# Patient Record
Sex: Male | Born: 1955 | Race: Asian | Hispanic: No | Marital: Married | Smoking: Never smoker
Health system: Southern US, Community
[De-identification: ages and names within clinical notes are randomized; demographics above are authoritative.]

## PROBLEM LIST (undated history)

## (undated) HISTORY — PX: APPENDECTOMY: SHX54

---

## 1997-12-18 ENCOUNTER — Encounter: Payer: Self-pay | Admitting: Emergency Medicine

## 1997-12-18 ENCOUNTER — Emergency Department (HOSPITAL_COMMUNITY): Admission: EM | Admit: 1997-12-18 | Discharge: 1997-12-18 | Payer: Self-pay | Admitting: Emergency Medicine

## 1997-12-19 ENCOUNTER — Inpatient Hospital Stay (HOSPITAL_COMMUNITY): Admission: EM | Admit: 1997-12-19 | Discharge: 1997-12-25 | Payer: Self-pay | Admitting: Emergency Medicine

## 1997-12-20 ENCOUNTER — Encounter: Payer: Self-pay | Admitting: Internal Medicine

## 1997-12-21 ENCOUNTER — Encounter: Payer: Self-pay | Admitting: Internal Medicine

## 1997-12-31 ENCOUNTER — Encounter: Admission: RE | Admit: 1997-12-31 | Discharge: 1997-12-31 | Payer: Self-pay | Admitting: Internal Medicine

## 2002-11-27 ENCOUNTER — Ambulatory Visit (HOSPITAL_BASED_OUTPATIENT_CLINIC_OR_DEPARTMENT_OTHER): Admission: RE | Admit: 2002-11-27 | Discharge: 2002-11-27 | Payer: Self-pay | Admitting: Orthopaedic Surgery

## 2016-08-28 ENCOUNTER — Emergency Department (HOSPITAL_COMMUNITY)
Admission: EM | Admit: 2016-08-28 | Discharge: 2016-08-29 | Disposition: A | Discharge status: Home / Self Care | Payer: Self-pay | Attending: Emergency Medicine | Admitting: Emergency Medicine

## 2016-08-28 ENCOUNTER — Emergency Department (HOSPITAL_COMMUNITY): Payer: Self-pay

## 2016-08-28 ENCOUNTER — Encounter (HOSPITAL_COMMUNITY): Payer: Self-pay

## 2016-08-28 DIAGNOSIS — Y999 Unspecified external cause status: Secondary | ICD-10-CM | POA: Insufficient documentation

## 2016-08-28 DIAGNOSIS — Y9389 Activity, other specified: Secondary | ICD-10-CM | POA: Insufficient documentation

## 2016-08-28 DIAGNOSIS — Y9289 Other specified places as the place of occurrence of the external cause: Secondary | ICD-10-CM | POA: Insufficient documentation

## 2016-08-28 DIAGNOSIS — W19XXXA Unspecified fall, initial encounter: Secondary | ICD-10-CM

## 2016-08-28 DIAGNOSIS — S2241XA Multiple fractures of ribs, right side, initial encounter for closed fracture: Secondary | ICD-10-CM | POA: Insufficient documentation

## 2016-08-28 DIAGNOSIS — R52 Pain, unspecified: Secondary | ICD-10-CM

## 2016-08-28 DIAGNOSIS — W1789XA Other fall from one level to another, initial encounter: Secondary | ICD-10-CM | POA: Insufficient documentation

## 2016-08-28 MED ORDER — OXYCODONE-ACETAMINOPHEN 5-325 MG PO TABS
1.0000 | ORAL_TABLET | Freq: Once | ORAL | Status: AC
  Administered 2016-08-29: 1 via ORAL
  Filled 2016-08-28: qty 1

## 2016-08-28 NOTE — ED Provider Notes (Signed)
[facility]-EMERGENCY DEPT Provider Note   CSN: [id number] Arrival date & time: 08/28/16  2210     History   Chief Complaint Chief Complaint  Patient presents with  . Fall    HPI Arthur Hernandez is a 61 y.o. male.  HPI   Patient p/w right posterior rib pain after slipping and falling out of the back of his pickup truck while unloading gear from a fishing trip.  Family reports he slipped and fell directly on his right ribs/back on the concrete.  Denies hitting head or LOC, anterior chest, abdominal pain, extremity pain/numbness/weakness.  Denies any other pain or injury.    History reviewed. No pertinent past medical history.  There are no active problems to display for this patient.   History reviewed. No pertinent surgical history.     Home Medications    Prior to Admission medications   Medication Sig Start Date End Date Taking? Authorizing Provider  oxyCODONE-acetaminophen (PERCOCET/ROXICET) 5-325 MG tablet Take 1-2 tablets by mouth every 4 (four) hours as needed for severe pain. 08/29/16   Trixie Dredge, PA-C    Family History History reviewed. No pertinent family history.  Social History Social History  Substance Use Topics  . Smoking status: Not on file  . Smokeless tobacco: Not on file  . Alcohol use Not on file     Allergies   Patient has no allergy information on record.   Review of Systems Review of Systems  All other systems reviewed and are negative.    Physical Exam Updated Vital Signs BP 111/71   Pulse (!) 59   Temp 99.1 F (37.3 C) (Oral)   Resp 18   SpO2 99%   Physical Exam  Constitutional: He appears well-developed and well-nourished. No distress.  HENT:  Head: Normocephalic and atraumatic.  Neck: Neck supple.  Cardiovascular: Normal rate, regular rhythm and intact distal pulses.   Pulmonary/Chest: Effort normal and breath sounds normal. No respiratory distress. He has no wheezes. He has no rales.      Abdominal: Soft. He  exhibits no distension and no mass. There is no tenderness. There is no rebound and no guarding.  Musculoskeletal:  Extremities:  Strength 5/5, sensation intact, distal pulses intact.    Neurological: He is alert. He exhibits normal muscle tone.  Skin: He is not diaphoretic.  Nursing note and vitals reviewed.    ED Treatments / Results  Labs (all labs ordered are listed, but only abnormal results are displayed) Labs Reviewed - No data to display  EKG  EKG Interpretation None       Radiology Dg Chest 2 View  Addendum Date: 08/28/2016   ADDENDUM REPORT: 08/28/2016 23:22 ADDENDUM: There is a minimally displaced right posterior eighth rib fracture better visualized on the dedicated rib radiographs. No pneumothorax is seen. Electronically Signed   By: Tollie Eth M.D.   On: 08/28/2016 23:22   Result Date: 08/28/2016 CLINICAL DATA:  Patient fell from pickup truck several hours ago. Right-sided posterior rib pain. EXAM: CHEST  2 VIEW COMPARISON:  Thoracic spine from 08/28/2016 FINDINGS: The cardiac silhouette is within normal limits for size. There is mild aortic atherosclerosis without aneurysm. No pneumothorax effusion or pulmonary consolidation is seen. No acute displaced appearing rib fracture is noted. Degenerative changes are noted of the dorsal spine without acute appearing fracture nor retropulsion. IMPRESSION: Aortic atherosclerosis. No acute pulmonary disease. No acute fracture of the bony thorax is identified. Electronically Signed: By: Tollie Eth M.D. On: 08/28/2016 23:13  Dg Ribs Unilateral Right  Result Date: 08/28/2016 CLINICAL DATA:  Right posterior rib pain after fall from truck EXAM: RIGHT RIBS - 2 VIEW COMPARISON:  None. FINDINGS: Minimally displaced posterior right eighth rib fracture without pneumothorax is noted. The linear lucency without displacement is also noted of the right tenth rib. No effusion. IMPRESSION: Subtle minimally displaced right posterior eighth rib  fracture with equivocal nondisplaced posterior right tenth rib fracture. No pneumothorax or hemothorax. Electronically Signed   By: Tollie Eth M.D.   On: 08/28/2016 23:21   Dg Thoracic Spine 2 View  Result Date: 08/28/2016 CLINICAL DATA:  61 year old male with fall and back pain. EXAM: THORACIC SPINE 2 VIEWS COMPARISON:  Chest radiograph dated 08/28/2016 FINDINGS: There is no acute fracture of the thoracic spine. There is mild degenerative changes. Multiple mid to lower thoracic mild compression changes appear chronic. Soft tissues appear unremarkable. IMPRESSION: No definite acute/traumatic thoracic spine pathology. Electronically Signed   By: Elgie Collard M.D.   On: 08/28/2016 23:12   Dg Lumbar Spine Complete  Result Date: 08/28/2016 CLINICAL DATA:  Fall from pickup truck, back and RIGHT rib pain. EXAM: LUMBAR SPINE - COMPLETE 4+ VIEW COMPARISON:  None. FINDINGS: Five non rib-bearing lumbar-type vertebral bodies are intact with maintenance of the lumbar lordosis. Grade 1 L4-5 anterolisthesis associated with severe disc height loss, endplate sclerosis and marginal spurring. Bilateral L4 pars interarticularis defects. No destructive bony lesions. Sacroiliac joints are symmetric. Included prevertebral and paraspinal soft tissue planes are non-suspicious. IMPRESSION: No acute fracture deformity. 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 Electronically Signed   By: Awilda Metro M.D.   On: 08/28/2016 23:14    Procedures Procedures (including critical care time)  Medications Ordered in ED Medications  oxyCODONE-acetaminophen (PERCOCET/ROXICET) 5-325 MG per tablet 1 tablet (not administered)     Initial Impression / Assessment and Plan / ED Course  I have reviewed the triage vital signs and the nursing notes.  Pertinent labs & imaging results that were available during my care of the patient were reviewed by me and considered  in my medical decision making (see chart for details).     Afebrile, nontoxic patient with right posterior rib pain after mechanical fall out of pickup truck.  Denies other injury.  He is otherwise healthy.  Breathing well.  Pain control, incentive spirometer.  O2 normal.   Definitive fracture care for rib fractures performed.  Discussed return precautions, home care.  D/C home with pain medication, incentive spirometer.  Discussed result, findings, treatment, and follow up  with patient.  Pt given return precautions.  Pt verbalizes understanding and agrees with plan.       Final Clinical Impressions(s) / ED Diagnoses   Final diagnoses:  Closed fracture of multiple ribs of right side, initial encounter  Fall, initial encounter    New Prescriptions New Prescriptions   OXYCODONE-ACETAMINOPHEN (PERCOCET/ROXICET) 5-325 MG TABLET    Take 1-2 tablets by mouth every 4 (four) hours as needed for severe pain.     Trixie Dredge, PA-C 08/29/16 0021    Zadie Rhine, MD 08/30/16 [phone]

## 2016-08-28 NOTE — ED Triage Notes (Signed)
Pt here for pain to lower ribs after falling form truck onto driveway

## 2016-08-29 MED ORDER — OXYCODONE-ACETAMINOPHEN 5-325 MG PO TABS: 1.0000 | ORAL_TABLET | ORAL | 0 refills | Status: AC | PRN

## 2016-08-29 NOTE — Discharge Instructions (Signed)
Read the information below.  Use the prescribed medication as directed.  Please discuss all new medications with your pharmacist.  Do not take additional tylenol while taking the prescribed pain medication to avoid overdose.  You may return to the Emergency Department at any time for worsening condition or any new symptoms that concern you.     If you develop worsening chest/back pain, shortness of breath, fever, you pass out, or become weak or dizzy, return to the ER for a recheck.

## 2016-08-29 NOTE — ED Notes (Signed)
Pt given incentive spirometer and demonstrated appropriate use.

## 2018-06-25 IMAGING — DX DG CHEST 2V
2 series · 2 of 2 positions shown · non-contrast
Comparison: Thoracic spine from 08/28/2016

ADDENDUM:
There is a minimally displaced right posterior eighth rib fracture
better visualized on the dedicated rib radiographs. No pneumothorax
is seen.
CLINICAL DATA: Patient fell from pickup truck several hours ago.
Right-sided posterior rib pain.

EXAM:
CHEST  2 VIEW

[chest pa]
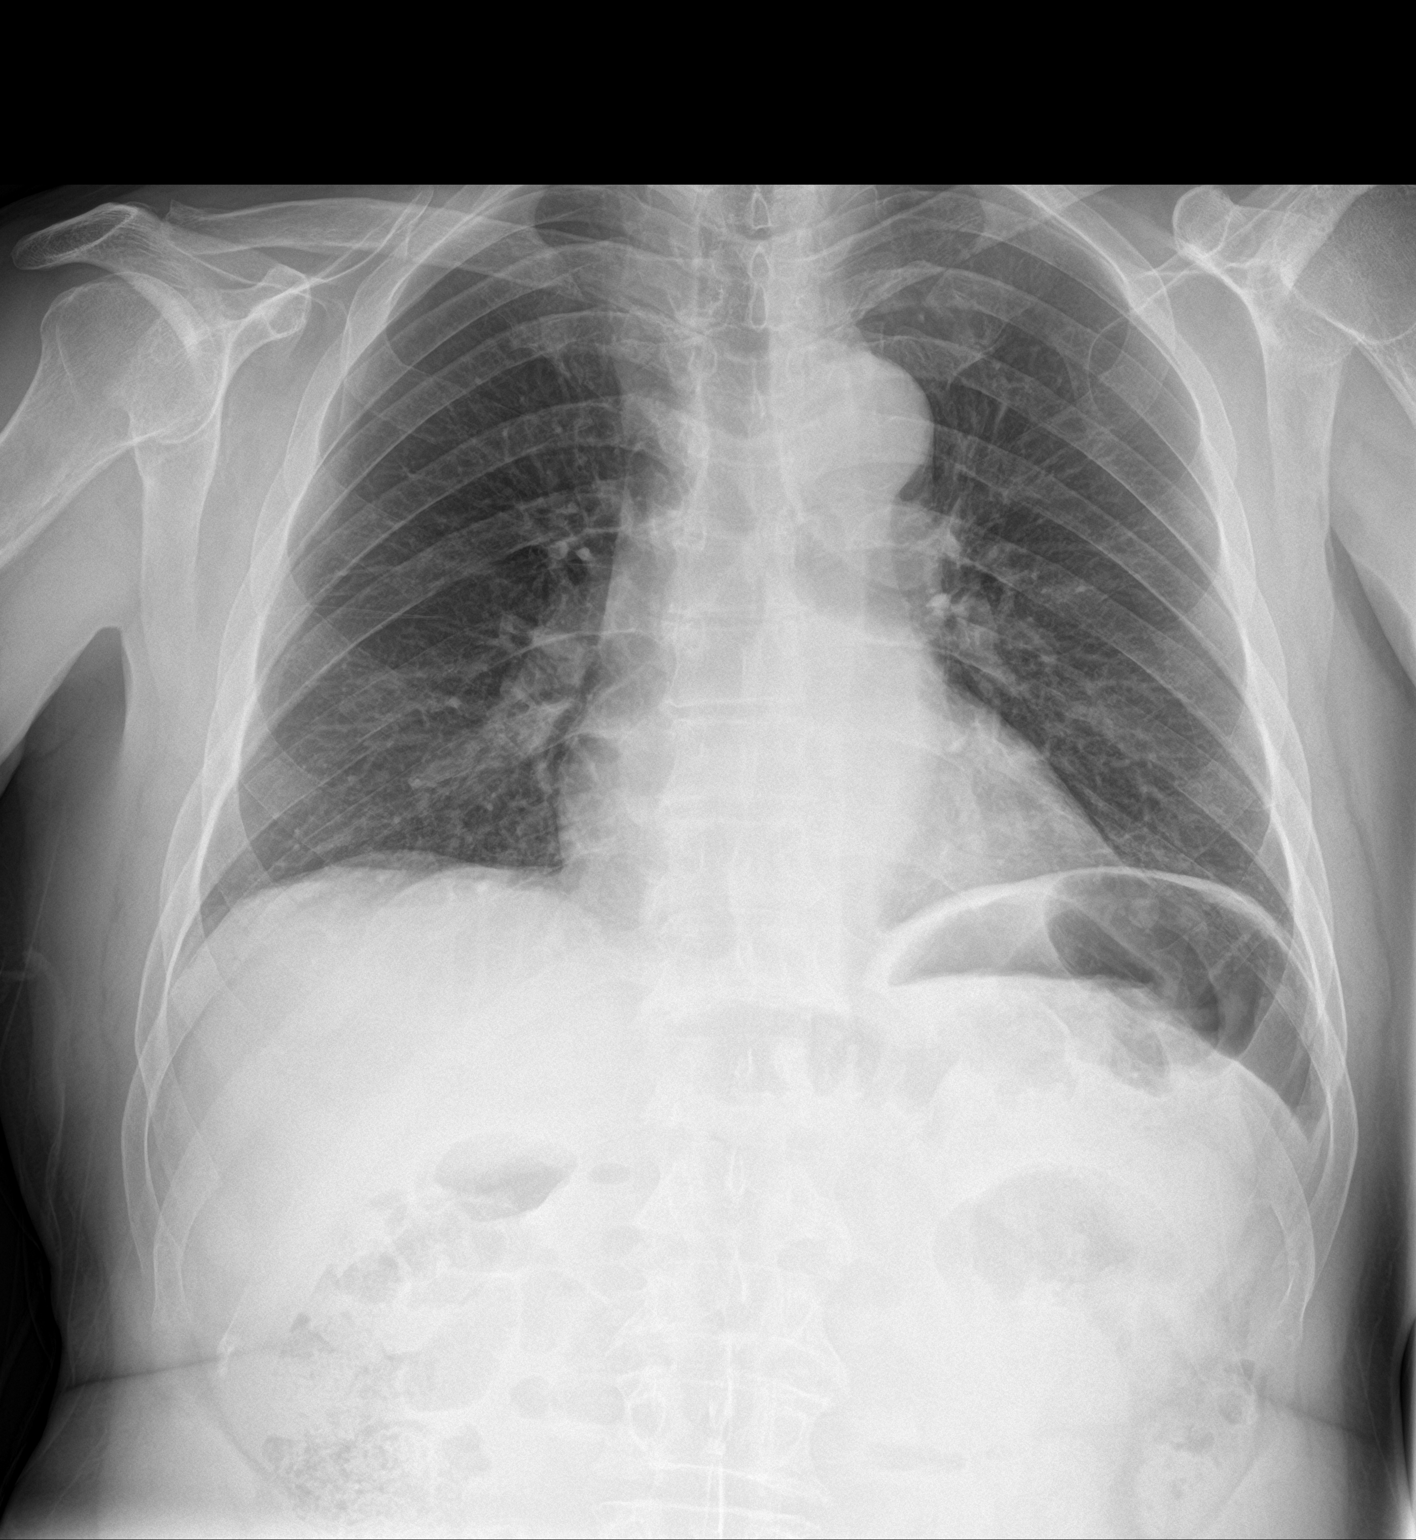

[chest lat]
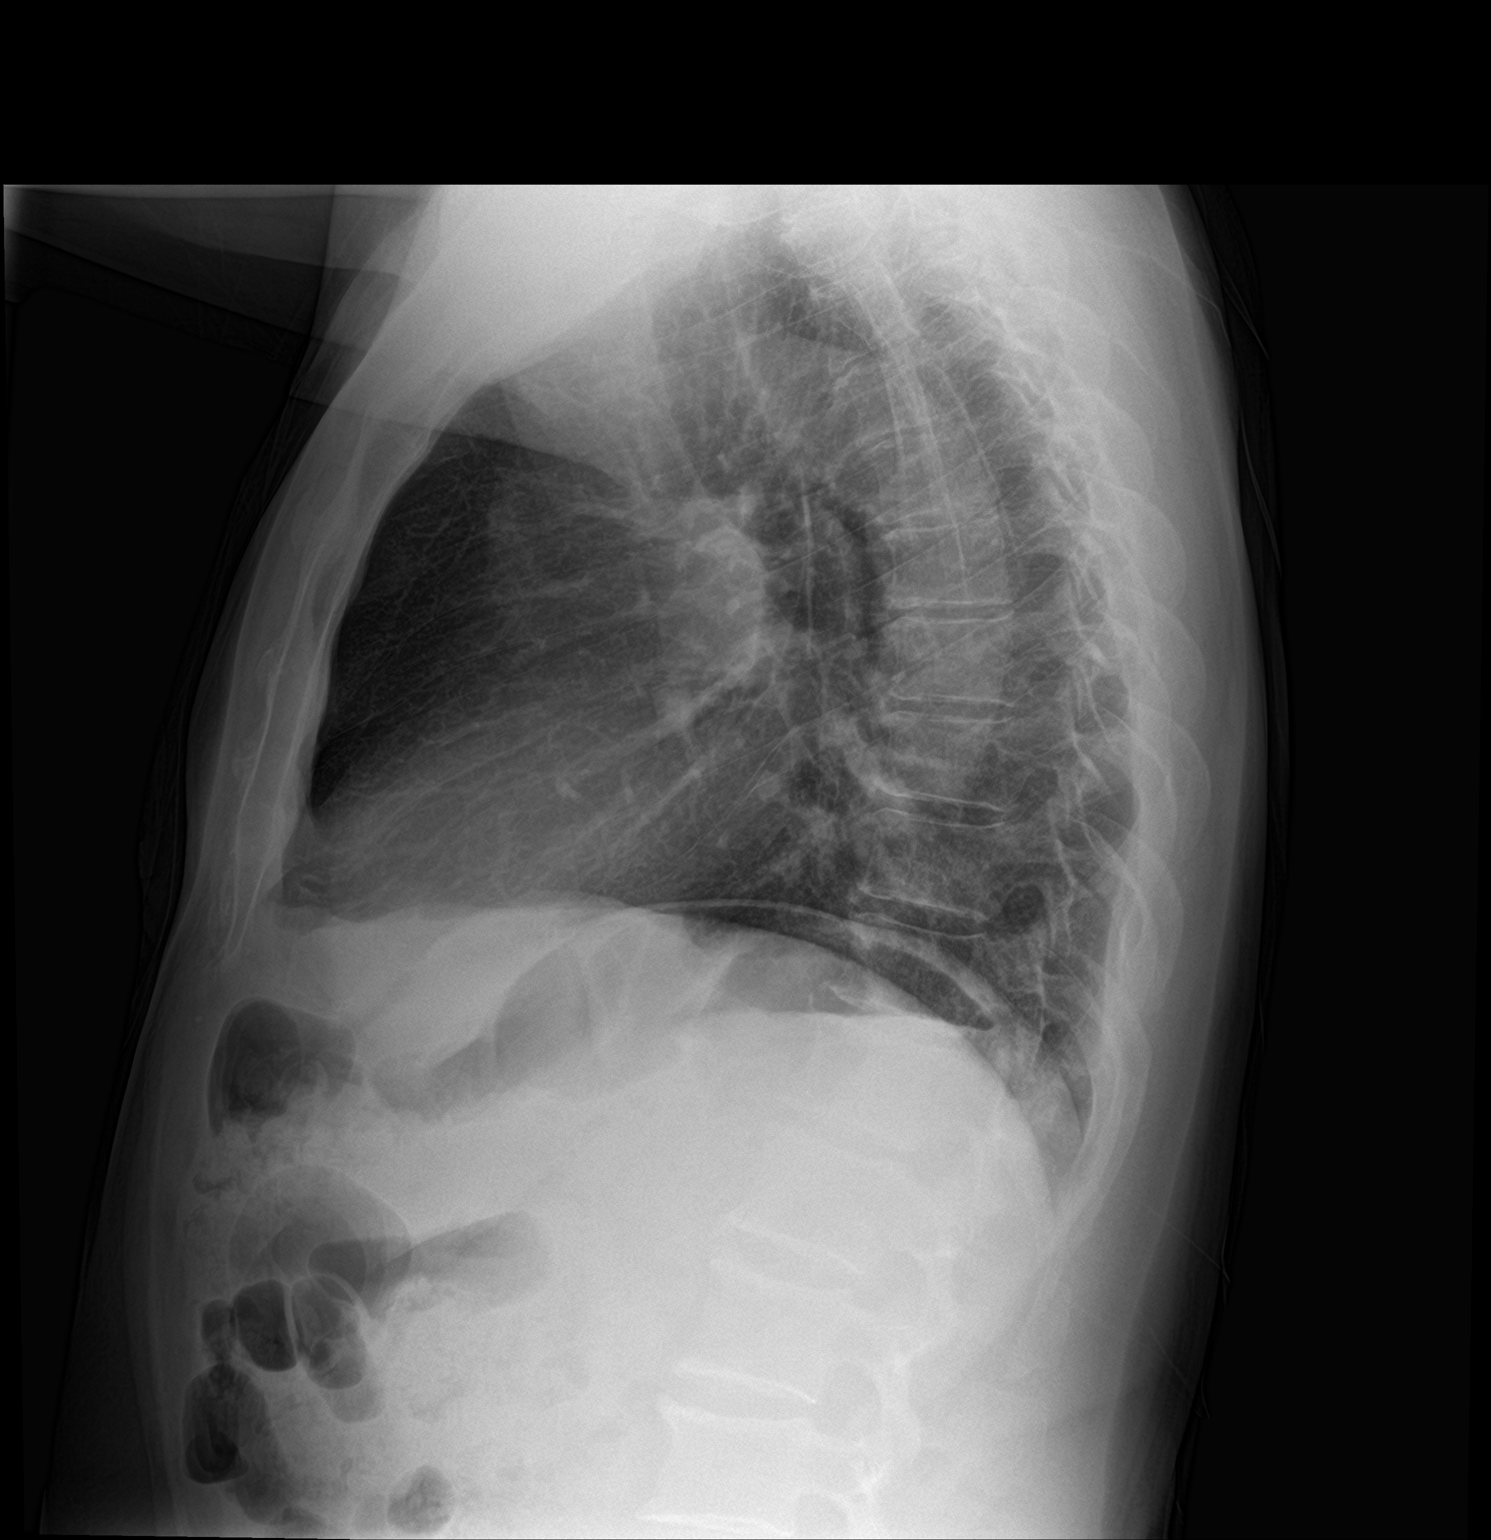

[2 of 2 positions shown; findings below may reference images not displayed]

FINDINGS: The cardiac silhouette is within normal limits for size. There is
mild aortic atherosclerosis without aneurysm. No pneumothorax
effusion or pulmonary consolidation is seen. No acute displaced
appearing rib fracture is noted. Degenerative changes are noted of
the dorsal spine without acute appearing fracture nor retropulsion.
IMPRESSION: Aortic atherosclerosis. No acute pulmonary disease. No acute
fracture of the bony thorax is identified.

## 2018-06-25 IMAGING — DX DG LUMBAR SPINE COMPLETE 4+V
5 series · 5 of 5 positions shown · non-contrast
Comparison: None.

CLINICAL DATA: Fall from pickup truck, back and RIGHT rib pain.

EXAM:
LUMBAR SPINE - COMPLETE 4+ VIEW

[l-spine ap]
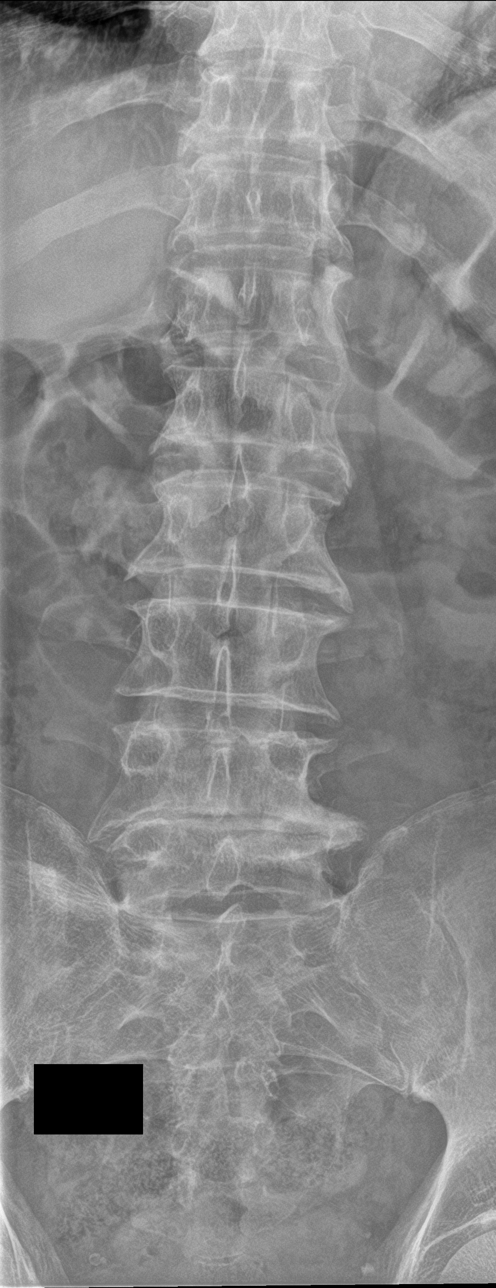

[l-spine obl (1 of 2)]
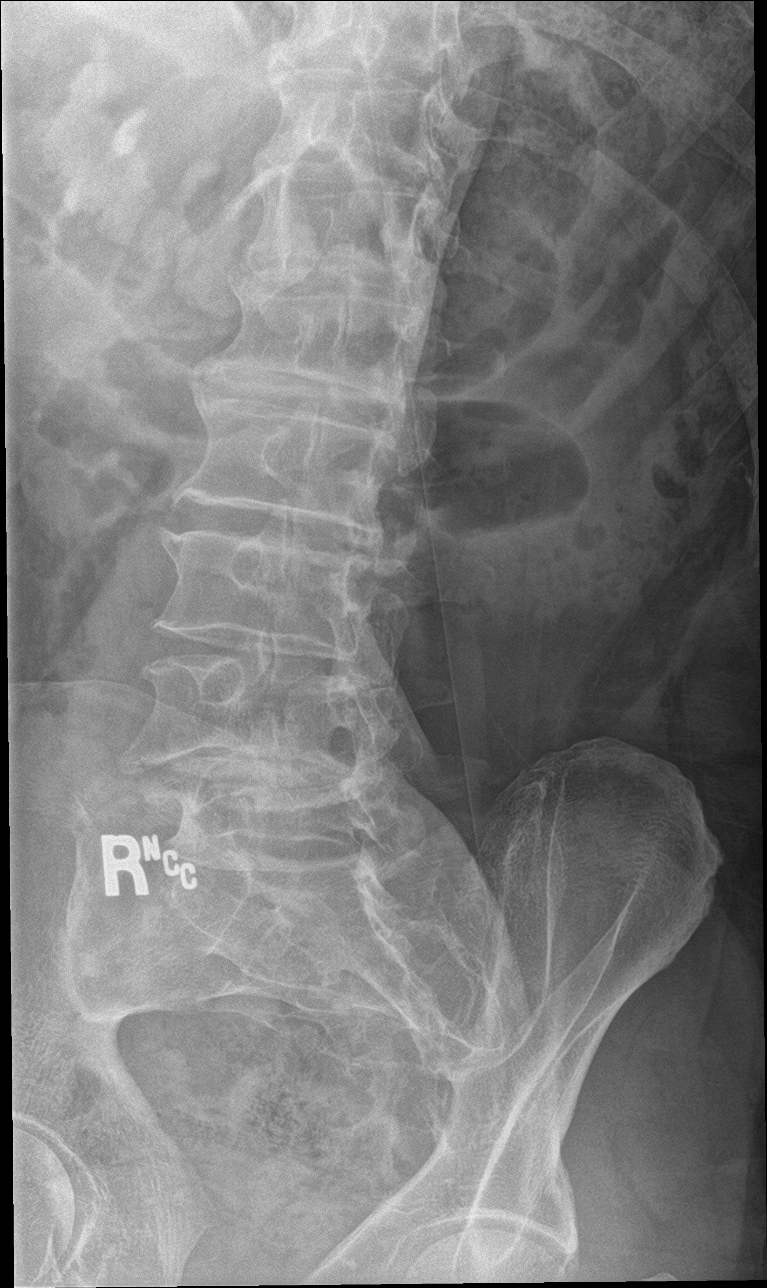

[l-spine obl (2 of 2)]
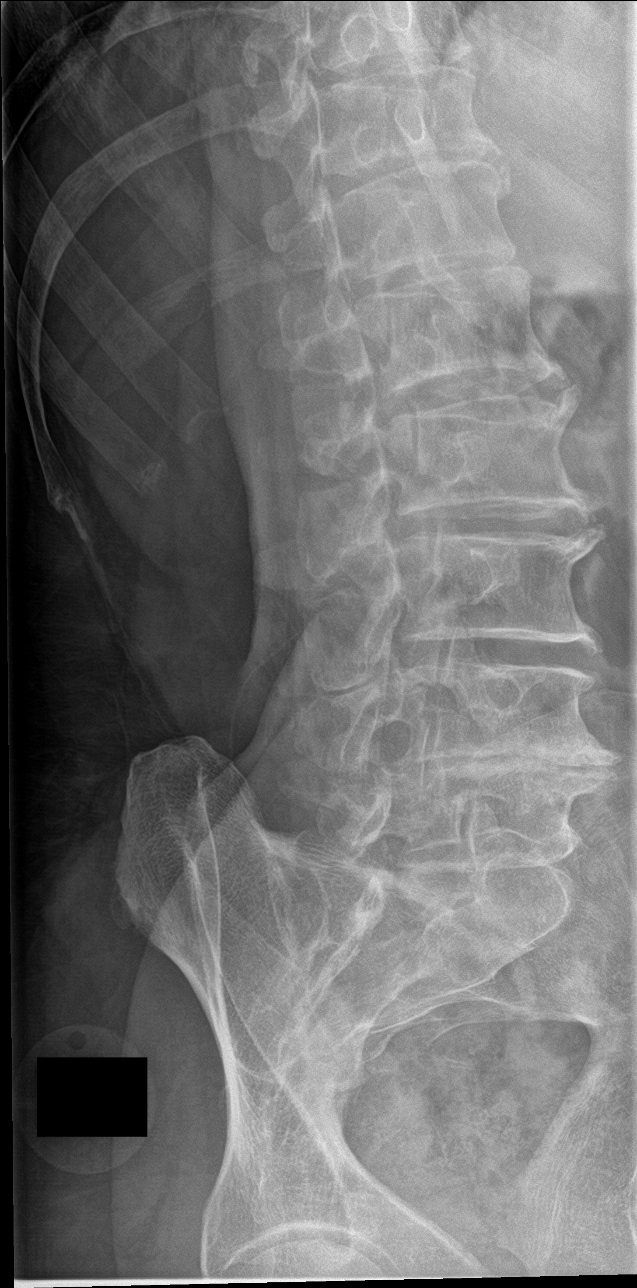

[l-spine lat]
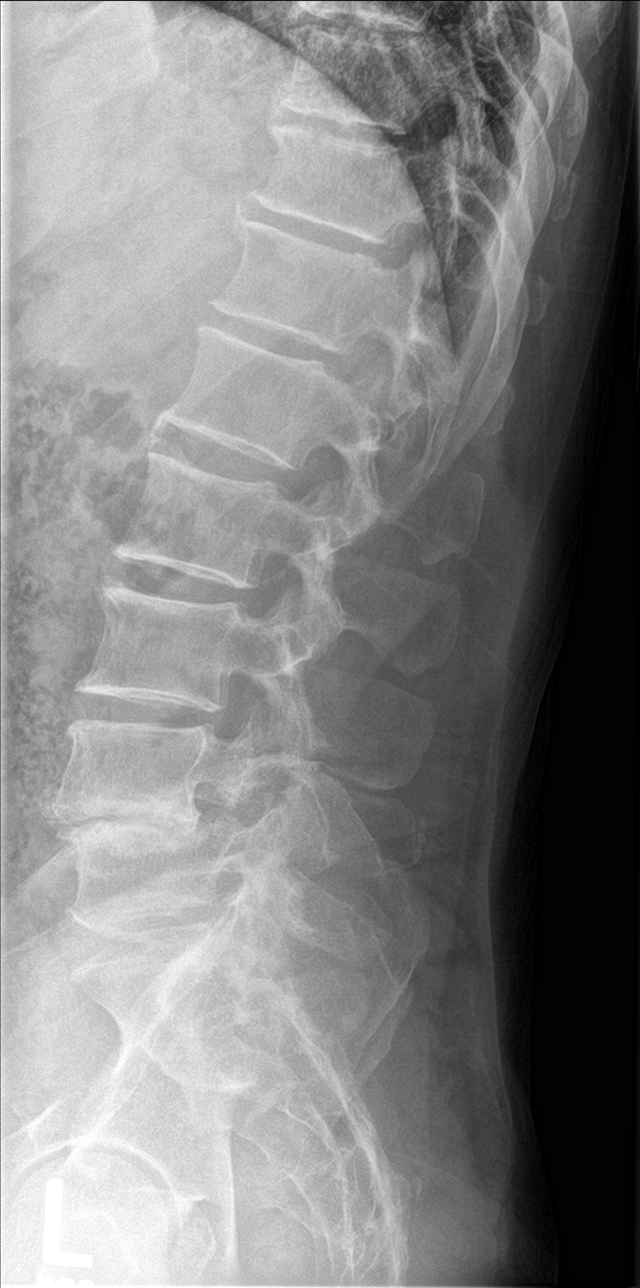

[l-spine spot]
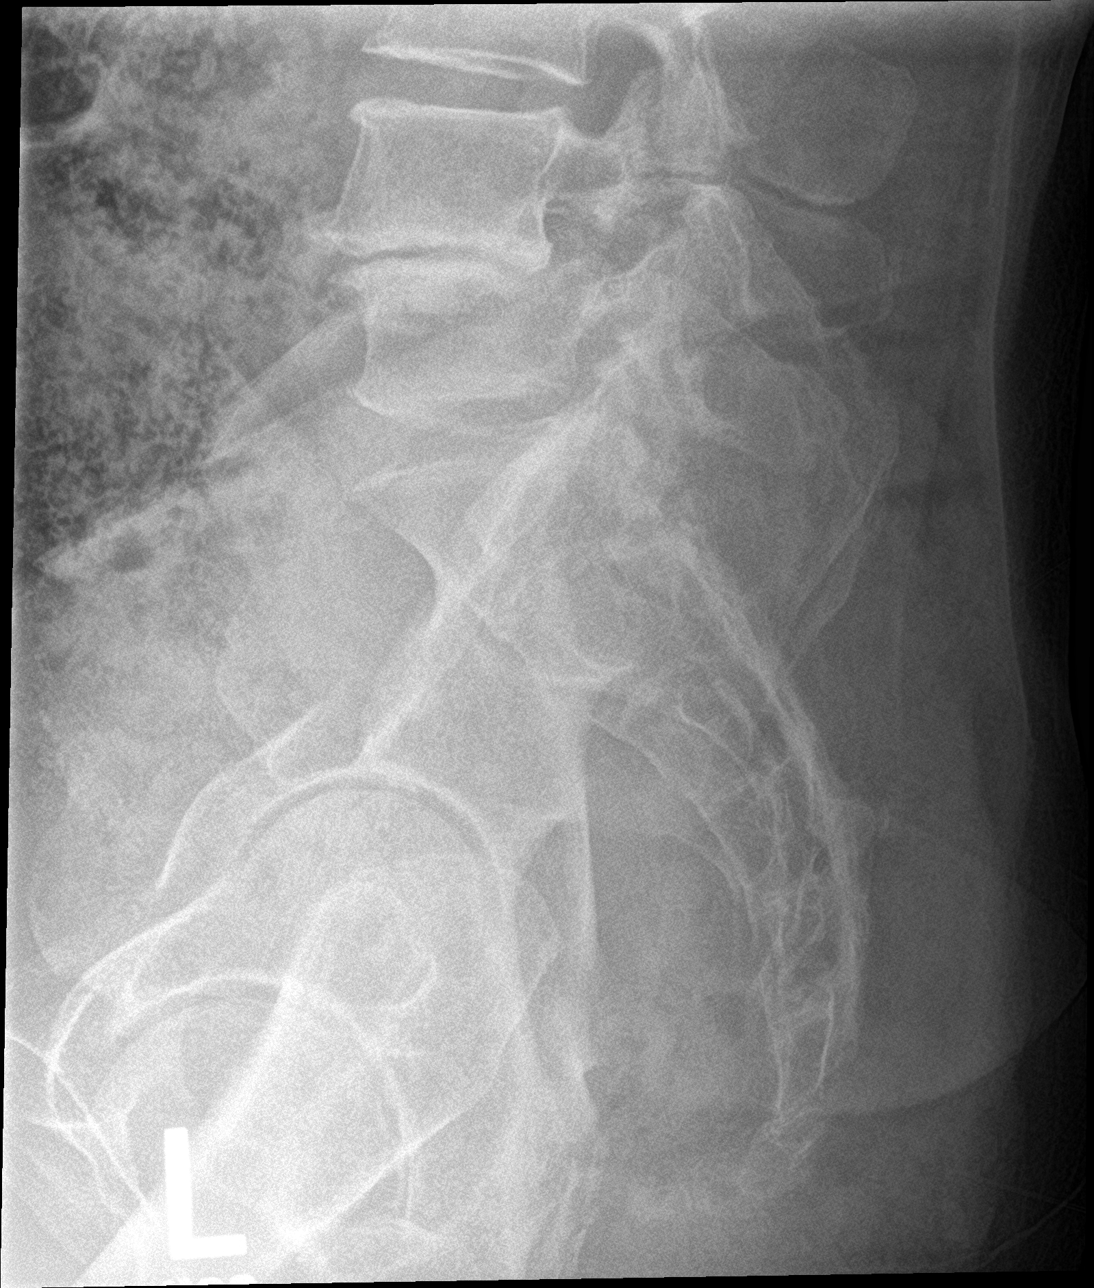

[5 of 5 positions shown; findings below may reference images not displayed]

FINDINGS: Five non rib-bearing lumbar-type vertebral bodies are intact with
maintenance of the lumbar lordosis. Grade 1 L4-5 anterolisthesis
associated with severe disc height loss, endplate sclerosis and
marginal spurring. Bilateral L4 pars interarticularis defects. No
destructive bony lesions.

Sacroiliac joints are symmetric. Included prevertebral and
paraspinal soft tissue planes are non-suspicious.
IMPRESSION: No acute fracture deformity.

Grade 1 L4-5 anterolisthesis, chronic L4 pars interarticularis
defects with severe L4-5 degenerative disc.

## 2019-01-17 ENCOUNTER — Other Ambulatory Visit: Payer: Self-pay

## 2019-01-17 DIAGNOSIS — Z20822 Contact with and (suspected) exposure to covid-19: Secondary | ICD-10-CM

## 2019-01-18 LAB — NOVEL CORONAVIRUS, NAA: SARS-CoV-2, NAA: DETECTED — AB

## 2022-09-07 ENCOUNTER — Encounter (HOSPITAL_COMMUNITY): Payer: Self-pay

## 2022-09-07 ENCOUNTER — Emergency Department (HOSPITAL_COMMUNITY)
Admission: EM | Admit: 2022-09-07 | Discharge: 2022-09-07 | Disposition: A | Discharge status: Home / Self Care | Payer: Medicare Other | Attending: Emergency Medicine | Admitting: Emergency Medicine

## 2022-09-07 ENCOUNTER — Emergency Department (HOSPITAL_COMMUNITY): Payer: Medicare Other

## 2022-09-07 ENCOUNTER — Other Ambulatory Visit: Payer: Self-pay

## 2022-09-07 DIAGNOSIS — S68625A Partial traumatic transphalangeal amputation of left ring finger, initial encounter: Secondary | ICD-10-CM | POA: Diagnosis not present

## 2022-09-07 DIAGNOSIS — S68119A Complete traumatic metacarpophalangeal amputation of unspecified finger, initial encounter: Secondary | ICD-10-CM

## 2022-09-07 DIAGNOSIS — W28XXXA Contact with powered lawn mower, initial encounter: Secondary | ICD-10-CM | POA: Diagnosis not present

## 2022-09-07 DIAGNOSIS — S68623A Partial traumatic transphalangeal amputation of left middle finger, initial encounter: Secondary | ICD-10-CM | POA: Diagnosis not present

## 2022-09-07 DIAGNOSIS — S61412A Laceration without foreign body of left hand, initial encounter: Secondary | ICD-10-CM | POA: Diagnosis not present

## 2022-09-07 DIAGNOSIS — S61215A Laceration without foreign body of left ring finger without damage to nail, initial encounter: Secondary | ICD-10-CM | POA: Diagnosis not present

## 2022-09-07 DIAGNOSIS — S68125A Partial traumatic metacarpophalangeal amputation of left ring finger, initial encounter: Secondary | ICD-10-CM | POA: Diagnosis not present

## 2022-09-07 DIAGNOSIS — S68123A Partial traumatic metacarpophalangeal amputation of left middle finger, initial encounter: Secondary | ICD-10-CM | POA: Diagnosis not present

## 2022-09-07 DIAGNOSIS — S61213A Laceration without foreign body of left middle finger without damage to nail, initial encounter: Secondary | ICD-10-CM | POA: Diagnosis not present

## 2022-09-07 MED ORDER — LIDOCAINE HCL (PF) 1 % IJ SOLN
30.0000 mL | Freq: Once | INTRAMUSCULAR | Status: AC
  Administered 2022-09-07: 30 mL
  Filled 2022-09-07: qty 30

## 2022-09-07 MED ORDER — CEPHALEXIN 500 MG PO CAPS: 500.0000 mg | ORAL_CAPSULE | Freq: Four times a day (QID) | ORAL | 0 refills | Status: AC

## 2022-09-07 MED ORDER — OXYCODONE-ACETAMINOPHEN 5-325 MG PO TABS
1.0000 | ORAL_TABLET | Freq: Once | ORAL | Status: AC
  Administered 2022-09-07: 1 via ORAL
  Filled 2022-09-07: qty 1

## 2022-09-07 MED ORDER — OXYCODONE HCL 5 MG PO TABS: 5.0000 mg | ORAL_TABLET | Freq: Four times a day (QID) | ORAL | 0 refills | Status: AC | PRN

## 2022-09-07 MED ORDER — MORPHINE SULFATE (PF) 4 MG/ML IV SOLN
4.0000 mg | Freq: Once | INTRAVENOUS | Status: AC
  Administered 2022-09-07: 4 mg via INTRAVENOUS
  Filled 2022-09-07: qty 1

## 2022-09-07 MED ORDER — CEFAZOLIN SODIUM-DEXTROSE 1-4 GM/50ML-% IV SOLN
1.0000 g | Freq: Once | INTRAVENOUS | Status: AC
  Administered 2022-09-07: 1 g via INTRAVENOUS
  Filled 2022-09-07: qty 50

## 2022-09-07 NOTE — ED Notes (Signed)
Patient was educated on dressing changes with hand by nurse at beside with family. Patient and family verbalized understanding. Questions were answered by nurse with the family. No concerns noted from family. Went over discharge instructions with family. Pain level addressed and vitals taken during discharge.

## 2022-09-07 NOTE — Discharge Instructions (Addendum)
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  You did cut off a small piece of bone on each fingertip however these did not need to be surgically repaired.  We did place stitches on both your fingers and you have 7 stitches on your middle finger and 6 stitches on your ring finger.  We placed a dressing with Xeroform (yellow gauze) and gauze and you should keep this in place for the next 24 hours.  You can wash your hands with soap and water and do a dressing change every 24 hours, just do not soak your hands in the water.  You should call the hand surgery clinic in the morning to schedule an appointment for wound recheck this week and for further management of your wound care.  I have given you antibiotics that you should take as prescribed to prevent infection.  You can take Tylenol and Motrin as needed for pain and both can be taken every 6 hours and I have given you oxycodone for breakthrough pain.  This can make you drowsy so do not take it before driving, working or operating heavy machinery.  You should return to the emergency department if you have pus draining from your wounds, spreading redness up your fingers and hand, you have fevers or if you have any other new or concerning symptoms.  Lo atendieron en el departamento de emergencias para la amputacin de las yemas de sus dedos. Cortaste un pequeo trozo de [employer] cada yema de los dedos, pero no fue necesario Training and development officer. Le colocamos puntos en ambos dedos y tiene 7 puntos en el dedo medio y 6 puntos en el anular. Le colocamos un apsito con Xeroform (gasa amarilla) y gasa y este deber mantenerlo colocado durante las prximas 24 horas. Puedes lavarte las manos con agua y [place] y Radio producer un cambio de apsito cada 24 horas, pero no remojes las Chiropractor. Debe llamar a la clnica de ciruga de la mano por la maana para programar una cita para volver a revisar la herida esta semana y para un mayor  control del cuidado de su herida. Le he dado antibiticos que debe tomar segn lo recetado para prevenir infecciones. Puede tomar Tylenol y Motrin segn sea necesario para el dolor y ambos pueden tomarse cada 6 horas y Chief Executive Officer he dado oxicodona para Chief Technology Officer irruptivo. Esto puede provocar somnolencia, por lo que no lo tome antes de Science writer, trabajar o utilizar maquinaria pesada. Debe regresar al departamento de emergencias si tiene pus drenando de sus heridas, enrojecimiento que se extiende por los dedos y [address], tiene fiebre o si tiene algn otro sntoma nuevo o preocupante.

## 2022-09-07 NOTE — ED Triage Notes (Signed)
Pt stuck his left hand at the bottom of a lawnmower that was still running and the blade cut the left 3rd and fourth finger tips off. Pt has towel on fingers and applying pressure to control bleeding.

## 2022-09-07 NOTE — ED Provider Notes (Signed)
[place] EMERGENCY DEPARTMENT AT [facility] Provider Note   CSN: [id number] Arrival date & time: 09/07/22  1724     History  Chief Complaint  Patient presents with   finger tips cut off    Arthur Hernandez is a 67 y.o. male.  Patient is a 67 year old male with no significant past medical history presenting to the emergency department with an accidental amputation to the tip of his fingers.  Patient states that he was cleaning underneath his lawnmower when he accidentally cut his left third and fourth digits.  He states that he immediately came to the emergency department.  States that family members did bring in the amputated tips of his fingers.  He denies any numbness or weakness.  He states his tetanus was last updated 2 weeks ago.  He states that he is right-hand dominant.  The history is provided by the patient and a relative. The history is limited by a language barrier. No language interpreter was used (Patient refused, requested daughter to translate).       Home Medications Prior to Admission medications   Medication Sig Start Date End Date Taking? Authorizing Provider  cephALEXin (KEFLEX) 500 MG capsule Take 1 capsule (500 mg total) by mouth 4 (four) times daily for 7 days. 09/07/22 09/14/22 Yes Theresia Lo, Benetta Spar K, DO  oxyCODONE (ROXICODONE) 5 MG immediate release tablet Take 1 tablet (5 mg total) by mouth every 6 (six) hours as needed for severe pain. 09/07/22  Yes Elayne Snare K, DO  oxyCODONE-acetaminophen (PERCOCET/ROXICET) 5-325 MG tablet Take 1-2 tablets by mouth every 4 (four) hours as needed for severe pain. 08/29/16   Trixie Dredge, PA-C      Allergies    Patient has no known allergies.    Review of Systems   Review of Systems  Physical Exam Updated Vital Signs BP 131/78 (BP Location: Right Arm)   Pulse 75   Temp 98.5 F (36.9 C) (Oral)   Resp 19   Ht 5\' 9"  (1.753 m)   Wt 72.6 kg   SpO2 100%   BMI 23.63 kg/m  Physical Exam Vitals  and nursing note reviewed.  Constitutional:      General: He is not in acute distress.    Appearance: Normal appearance.  HENT:     Head: Normocephalic and atraumatic.     Nose: Nose normal.  Eyes:     Extraocular Movements: Extraocular movements intact.  Cardiovascular:     Rate and Rhythm: Normal rate.     Pulses: Normal pulses.  Pulmonary:     Effort: Pulmonary effort is normal.  Abdominal:     General: Abdomen is flat.  Musculoskeletal:        General: Normal range of motion.     Cervical back: Normal range of motion.     Comments: Amputation/avulsion to tips of L 3rd and 4th digits with oozing, no obvious foreign body or bony fragments visualized Flexion/extension of all MCP/PIP/DIP of L hand digits intact  Skin:    General: Skin is warm and dry.     Comments: Avulsion/amputation injury as above  Neurological:     General: No focal deficit present.     Mental Status: He is alert and oriented to person, place, and time.     Comments: Strength and sensation intact in all digits of L hand        ED Results / Procedures / Treatments   Labs (all labs ordered are listed, but only abnormal  results are displayed) Labs Reviewed - No data to display  EKG None  Radiology DG Hand Complete Left  Result Date: 09/07/2022 CLINICAL DATA:  Avulsion injury, evaluate for fracture. 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 Blade cut off third and fourth fingertips. EXAM: LEFT HAND - COMPLETE 3+ VIEW COMPARISON:  None Available. FINDINGS: There is osseous and soft tissue amputation of the third and fourth distal phalanges at the midportion. No additional fracture. The remaining digits and hand are intact. No radiopaque foreign body. IMPRESSION: Osseous and soft tissue amputation of the third and fourth distal phalanges at the midportion. Electronically Signed   By: Narda Rutherford M.D.   On: 09/07/2022 18:29    Procedures .Marland KitchenLaceration Repair  Date/Time: 09/07/2022 9:01 PM  Performed by: Rexford Maus, DO Authorized by: Rexford Maus, DO   Consent:    Consent obtained:  Verbal   Consent given by:  Patient   Risks, benefits, and alternatives were discussed: yes     Risks discussed:  Infection, need for additional repair, nerve damage, pain, poor cosmetic result, poor wound healing, retained foreign body, tendon damage and vascular damage   Alternatives discussed:  No treatment and delayed treatment Universal protocol:    Procedure explained and questions answered to patient or proxy's satisfaction: yes     Patient identity confirmed:  Verbally with patient Anesthesia:    Anesthesia method:  Nerve block   Block location:  Left 3rd digits   Block needle gauge:  27 G   Block anesthetic:  Lidocaine 1% w/o epi   Block technique:  Ring block   Block injection procedure:  Anatomic landmarks identified, anatomic landmarks palpated, introduced needle, negative aspiration for blood and incremental injection   Block outcome:  Anesthesia achieved Laceration details:    Location:  Finger   Finger location:  L long finger   Length (cm):  2   Depth (mm):  5 Pre-procedure details:    Preparation:  Patient was prepped and draped in usual sterile fashion and imaging obtained to evaluate for foreign bodies Exploration:    Limited defect created (wound extended): yes     Hemostasis achieved with:  Direct pressure   Imaging obtained: x-ray     Imaging outcome: foreign body not noted     Wound exploration: wound explored through full range of motion and entire depth of wound visualized     Wound extent: underlying fracture     Wound extent: areolar tissue not violated, fascia not violated, no foreign body, no signs of injury, no nerve damage, no tendon damage and no vascular damage     Contaminated: no   Treatment:    Area cleansed with:  Saline   Amount of cleaning:  Standard   Irrigation solution:  Sterile saline   Irrigation volume:  150   Irrigation method:  Pressure wash    Visualized foreign bodies/material removed: no     Debridement:  None   Undermining:  None   Scar revision: no   Skin repair:    Repair method:  Sutures   Suture size:  4-0   Suture material:  Prolene   Suture technique:  Simple interrupted and horizontal mattress   Number of sutures:  7 Approximation:    Approximation:  Close Repair type:    Repair type:  Simple Post-procedure details:    Dressing:  Non-adherent dressing and bulky dressing   Procedure completion:  Tolerated well, no immediate complications .Marland KitchenLaceration Repair  Date/Time: 09/07/2022 9:03 PM  Performed by: Rexford Maus, DO Authorized by: Rexford Maus, DO   Consent:    Consent obtained:  Verbal   Consent given by:  Patient   Risks, benefits, and alternatives were discussed: yes     Risks discussed:  Infection, need for additional repair, nerve damage, pain, poor cosmetic result, poor wound healing, retained foreign body, tendon damage and vascular damage   Alternatives discussed:  No treatment and delayed treatment Universal protocol:    Procedure explained and questions answered to patient or proxy's satisfaction: yes     Patient identity confirmed:  Verbally with patient Anesthesia:    Anesthesia method:  Nerve block   Block location:  L 4th digit   Block needle gauge:  27 G   Block anesthetic:  Lidocaine 1% w/o epi   Block technique:  Ring block   Block injection procedure:  Anatomic landmarks identified, anatomic landmarks palpated, introduced needle, negative aspiration for blood and incremental injection   Block outcome:  Anesthesia achieved Laceration details:    Location:  Finger   Finger location:  L ring finger   Length (cm):  2   Depth (mm):  5 Pre-procedure details:    Preparation:  Patient was prepped and draped in usual sterile fashion and imaging obtained to evaluate for foreign bodies Exploration:    Limited defect created (wound extended): no     Hemostasis achieved with:   Direct pressure   Imaging obtained: x-ray     Imaging outcome: foreign body not noted     Wound exploration: wound explored through full range of motion and entire depth of wound visualized     Wound extent: underlying fracture     Wound extent: areolar tissue not violated, fascia not violated, no foreign body, no signs of injury, no nerve damage, no tendon damage and no vascular damage     Contaminated: no   Treatment:    Area cleansed with:  Saline   Amount of cleaning:  Standard   Irrigation solution:  Sterile saline   Irrigation volume:  150   Irrigation method:  Pressure wash   Visualized foreign bodies/material removed: no     Debridement:  None   Undermining:  None   Scar revision: no   Skin repair:    Repair method:  Sutures   Suture size:  4-0   Suture material:  Prolene   Suture technique:  Simple interrupted and horizontal mattress   Number of sutures:  6 Approximation:    Approximation:  Close Repair type:    Repair type:  Simple Post-procedure details:    Dressing:  Non-adherent dressing and bulky dressing   Procedure completion:  Tolerated well, no immediate complications     Medications Ordered in ED Medications  morphine (PF) 4 MG/ML injection 4 mg (4 mg Intravenous Given 09/07/22 1753)  morphine (PF) 4 MG/ML injection 4 mg (4 mg Intravenous Given 09/07/22 1850)  ceFAZolin (ANCEF) IVPB 1 g/50 mL premix (0 g Intravenous Stopped 09/07/22 2003)  lidocaine (PF) (XYLOCAINE) 1 % injection 30 mL (30 mLs Infiltration Given 09/07/22 2003)  oxyCODONE-acetaminophen (PERCOCET/ROXICET) 5-325 MG per tablet 1 tablet (1 tablet Oral Given 09/07/22 2106)    ED Course/ Medical Decision Making/ A&P Clinical Course as of 09/07/22 2114  Tue Sep 07, 2022  1900 I spoke with Dr. Kerry Fort of hand who recommended wound irrigation, attempting to close tissues over the bone, one dose of IV antibiotics now and discharge on Keflex. He can follow up in  the office this week for further wound  care management. [VK]    Clinical Course User Index [VK] Rexford Maus, DO                             Medical Decision Making This patient presents to the ED with chief complaint(s) of finger amputation with no pertinent past medical history which further complicates the presenting complaint. The complaint involves an extensive differential diagnosis and also carries with it a high risk of complications and morbidity.    The differential diagnosis includes skin avulsion, amputation, open fracture, no evidence of neurovascular or ligamentous injury on exam   Additional history obtained: Additional history obtained from family Records reviewed N/A  ED Course and Reassessment: On patient's arrival he is hemodynamically stable, uncomfortable appearing and was given morphine for pain.  He did have an obvious avulsion/amputation injury to distal tips of his left third and fourth digits and will have x-ray performed to evaluate for possible open fracture.  His tetanus is updated will not require update today.  He will require hand surgery consultation for recommendations on management.  Independent labs interpretation:  N/A  Independent visualization of imaging: - I independently visualized the following imaging with scope of interpretation limited to determining acute life threatening conditions related to emergency care: L hand XR, which revealed amputation of distal tips of DIP of 3rd and 4th digits  Consultation: - Consulted or discussed management/test interpretation w/ external professional: hand surgery  Consideration for admission or further workup: Patient has no emergent conditions requiring admission or further work-up at this time and is stable for discharge home with hand surgery follow-up  Social Determinants of health: N/A    Amount and/or Complexity of Data Reviewed Radiology: ordered.  Risk Prescription drug management.          Final Clinical  Impression(s) / ED Diagnoses Final diagnoses:  Fingertip amputation, initial encounter    Rx / DC Orders ED Discharge Orders          Ordered    cephALEXin (KEFLEX) 500 MG capsule  4 times daily        09/07/22 2110    oxyCODONE (ROXICODONE) 5 MG immediate release tablet  Every 6 hours PRN        09/07/22 2110              Rexford Maus, DO 09/07/22 2114

## 2022-09-09 DIAGNOSIS — S68123A Partial traumatic metacarpophalangeal amputation of left middle finger, initial encounter: Secondary | ICD-10-CM | POA: Diagnosis not present

## 2022-09-09 DIAGNOSIS — S68125A Partial traumatic metacarpophalangeal amputation of left ring finger, initial encounter: Secondary | ICD-10-CM | POA: Diagnosis not present

## 2022-09-16 DIAGNOSIS — S68119A Complete traumatic metacarpophalangeal amputation of unspecified finger, initial encounter: Secondary | ICD-10-CM | POA: Diagnosis not present

## 2022-09-28 DIAGNOSIS — S68125A Partial traumatic metacarpophalangeal amputation of left ring finger, initial encounter: Secondary | ICD-10-CM | POA: Diagnosis not present

## 2022-09-28 DIAGNOSIS — S68123A Partial traumatic metacarpophalangeal amputation of left middle finger, initial encounter: Secondary | ICD-10-CM | POA: Diagnosis not present

## 2022-10-05 DIAGNOSIS — S68123A Partial traumatic metacarpophalangeal amputation of left middle finger, initial encounter: Secondary | ICD-10-CM | POA: Diagnosis not present

## 2022-10-05 DIAGNOSIS — S68125A Partial traumatic metacarpophalangeal amputation of left ring finger, initial encounter: Secondary | ICD-10-CM | POA: Diagnosis not present

## 2022-10-07 DIAGNOSIS — M25642 Stiffness of left hand, not elsewhere classified: Secondary | ICD-10-CM | POA: Diagnosis not present

## 2022-10-07 DIAGNOSIS — M79645 Pain in left finger(s): Secondary | ICD-10-CM | POA: Diagnosis not present

## 2022-10-07 DIAGNOSIS — S68119A Complete traumatic metacarpophalangeal amputation of unspecified finger, initial encounter: Secondary | ICD-10-CM | POA: Diagnosis not present

## 2022-10-14 DIAGNOSIS — M79645 Pain in left finger(s): Secondary | ICD-10-CM | POA: Diagnosis not present

## 2022-10-14 DIAGNOSIS — M25642 Stiffness of left hand, not elsewhere classified: Secondary | ICD-10-CM | POA: Diagnosis not present

## 2022-10-14 DIAGNOSIS — S68123A Partial traumatic metacarpophalangeal amputation of left middle finger, initial encounter: Secondary | ICD-10-CM | POA: Diagnosis not present

## 2022-10-14 DIAGNOSIS — S68125A Partial traumatic metacarpophalangeal amputation of left ring finger, initial encounter: Secondary | ICD-10-CM | POA: Diagnosis not present

## 2022-10-14 DIAGNOSIS — S68119A Complete traumatic metacarpophalangeal amputation of unspecified finger, initial encounter: Secondary | ICD-10-CM | POA: Diagnosis not present

## 2022-10-18 DIAGNOSIS — S68125A Partial traumatic metacarpophalangeal amputation of left ring finger, initial encounter: Secondary | ICD-10-CM | POA: Diagnosis not present

## 2022-10-18 DIAGNOSIS — S68123A Partial traumatic metacarpophalangeal amputation of left middle finger, initial encounter: Secondary | ICD-10-CM | POA: Diagnosis not present

## 2022-12-06 DIAGNOSIS — S68119A Complete traumatic metacarpophalangeal amputation of unspecified finger, initial encounter: Secondary | ICD-10-CM | POA: Diagnosis not present
